# Patient Record
Sex: Male | Born: 1960 | Race: White | Hispanic: No | Marital: Single | Smoking: Never smoker
Health system: Southern US, Community
[De-identification: ages and names within clinical notes are randomized; demographics above are authoritative.]

## PROBLEM LIST (undated history)

## (undated) DIAGNOSIS — F32A Depression, unspecified: Secondary | ICD-10-CM

## (undated) DIAGNOSIS — E119 Type 2 diabetes mellitus without complications: Secondary | ICD-10-CM

## (undated) DIAGNOSIS — F329 Major depressive disorder, single episode, unspecified: Secondary | ICD-10-CM

## (undated) DIAGNOSIS — I10 Essential (primary) hypertension: Secondary | ICD-10-CM

## (undated) DIAGNOSIS — E7800 Pure hypercholesterolemia, unspecified: Secondary | ICD-10-CM

---

## 1999-09-01 ENCOUNTER — Emergency Department (HOSPITAL_COMMUNITY): Admission: EM | Admit: 1999-09-01 | Discharge: 1999-09-01 | Payer: Self-pay

## 2000-04-26 ENCOUNTER — Encounter: Payer: Self-pay | Admitting: Emergency Medicine

## 2000-04-26 ENCOUNTER — Emergency Department (HOSPITAL_COMMUNITY): Admission: EM | Admit: 2000-04-26 | Discharge: 2000-04-26 | Payer: Self-pay | Admitting: Emergency Medicine

## 2006-07-31 ENCOUNTER — Emergency Department (HOSPITAL_COMMUNITY): Admission: EM | Admit: 2006-07-31 | Discharge: 2006-07-31 | Payer: Self-pay | Admitting: Family Medicine

## 2006-11-18 ENCOUNTER — Emergency Department (HOSPITAL_COMMUNITY): Admission: EM | Admit: 2006-11-18 | Discharge: 2006-11-19 | Payer: Self-pay | Admitting: Emergency Medicine

## 2006-12-01 ENCOUNTER — Encounter: Admission: RE | Admit: 2006-12-01 | Discharge: 2007-03-01 | Payer: Self-pay | Admitting: Orthopedic Surgery

## 2006-12-28 ENCOUNTER — Emergency Department (HOSPITAL_COMMUNITY): Admission: EM | Admit: 2006-12-28 | Discharge: 2006-12-28 | Payer: Self-pay | Admitting: Family Medicine

## 2007-03-02 ENCOUNTER — Encounter: Admission: RE | Admit: 2007-03-02 | Discharge: 2007-05-04 | Payer: Self-pay | Admitting: Orthopedic Surgery

## 2007-03-13 ENCOUNTER — Encounter: Admission: RE | Admit: 2007-03-13 | Discharge: 2007-05-04 | Payer: Self-pay | Admitting: Orthopedic Surgery

## 2007-06-11 ENCOUNTER — Emergency Department (HOSPITAL_COMMUNITY): Admission: EM | Admit: 2007-06-11 | Discharge: 2007-06-11 | Payer: Self-pay | Admitting: Family Medicine

## 2007-07-29 ENCOUNTER — Emergency Department (HOSPITAL_COMMUNITY): Admission: EM | Admit: 2007-07-29 | Discharge: 2007-07-29 | Payer: Self-pay | Admitting: Family Medicine

## 2007-09-09 ENCOUNTER — Emergency Department (HOSPITAL_COMMUNITY): Admission: EM | Admit: 2007-09-09 | Discharge: 2007-09-09 | Payer: Self-pay | Admitting: Emergency Medicine

## 2007-09-30 ENCOUNTER — Emergency Department (HOSPITAL_COMMUNITY): Admission: EM | Admit: 2007-09-30 | Discharge: 2007-09-30 | Payer: Self-pay | Admitting: Emergency Medicine

## 2007-11-06 ENCOUNTER — Emergency Department (HOSPITAL_COMMUNITY): Admission: EM | Admit: 2007-11-06 | Discharge: 2007-11-06 | Payer: Self-pay | Admitting: Emergency Medicine

## 2007-11-07 ENCOUNTER — Inpatient Hospital Stay (HOSPITAL_COMMUNITY): Admission: AD | Admit: 2007-11-07 | Discharge: 2007-11-10 | Payer: Self-pay | Admitting: Psychiatry

## 2007-11-07 ENCOUNTER — Ambulatory Visit: Payer: Self-pay | Admitting: Psychiatry

## 2008-02-10 ENCOUNTER — Emergency Department (HOSPITAL_COMMUNITY): Admission: EM | Admit: 2008-02-10 | Discharge: 2008-02-10 | Payer: Self-pay | Admitting: Emergency Medicine

## 2008-02-26 ENCOUNTER — Emergency Department (HOSPITAL_COMMUNITY): Admission: EM | Admit: 2008-02-26 | Discharge: 2008-02-26 | Payer: Self-pay | Admitting: Family Medicine

## 2008-04-02 ENCOUNTER — Emergency Department (HOSPITAL_COMMUNITY): Admission: EM | Admit: 2008-04-02 | Discharge: 2008-04-02 | Payer: Self-pay | Admitting: Emergency Medicine

## 2008-05-08 ENCOUNTER — Emergency Department (HOSPITAL_COMMUNITY): Admission: EM | Admit: 2008-05-08 | Discharge: 2008-05-08 | Payer: Self-pay | Admitting: Emergency Medicine

## 2008-06-15 ENCOUNTER — Emergency Department (HOSPITAL_COMMUNITY): Admission: EM | Admit: 2008-06-15 | Discharge: 2008-06-15 | Payer: Self-pay | Admitting: Family Medicine

## 2008-10-06 ENCOUNTER — Emergency Department (HOSPITAL_COMMUNITY): Admission: EM | Admit: 2008-10-06 | Discharge: 2008-10-06 | Payer: Self-pay | Admitting: Family Medicine

## 2008-10-26 ENCOUNTER — Emergency Department (HOSPITAL_COMMUNITY): Admission: EM | Admit: 2008-10-26 | Discharge: 2008-10-26 | Payer: Self-pay | Admitting: Family Medicine

## 2009-01-12 ENCOUNTER — Emergency Department (HOSPITAL_COMMUNITY): Admission: EM | Admit: 2009-01-12 | Discharge: 2009-01-12 | Payer: Self-pay | Admitting: Family Medicine

## 2009-06-16 ENCOUNTER — Emergency Department (HOSPITAL_COMMUNITY): Admission: EM | Admit: 2009-06-16 | Discharge: 2009-06-16 | Payer: Self-pay | Admitting: Emergency Medicine

## 2009-07-30 ENCOUNTER — Emergency Department (HOSPITAL_COMMUNITY): Admission: EM | Admit: 2009-07-30 | Discharge: 2009-07-30 | Payer: Self-pay | Admitting: Family Medicine

## 2009-08-13 ENCOUNTER — Emergency Department (HOSPITAL_COMMUNITY): Admission: EM | Admit: 2009-08-13 | Discharge: 2009-08-13 | Payer: Self-pay | Admitting: Emergency Medicine

## 2010-06-18 LAB — POCT I-STAT, CHEM 8
BUN: 25 mg/dL — ABNORMAL HIGH (ref 6–23)
Calcium, Ion: 1.05 mmol/L — ABNORMAL LOW (ref 1.12–1.32)
Chloride: 103 mEq/L (ref 96–112)
HCT: 51 % (ref 39.0–52.0)
Potassium: 3.4 mEq/L — ABNORMAL LOW (ref 3.5–5.1)

## 2010-06-18 LAB — GLUCOSE, CAPILLARY: Glucose-Capillary: 214 mg/dL — ABNORMAL HIGH (ref 70–99)
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
## 2010-12-09 LAB — GLUCOSE, CAPILLARY
Glucose-Capillary: 236 — ABNORMAL HIGH
Glucose-Capillary: 256 — ABNORMAL HIGH
Glucose-Capillary: 256 — ABNORMAL HIGH
Glucose-Capillary: 261 — ABNORMAL HIGH
Glucose-Capillary: 269 — ABNORMAL HIGH
Glucose-Capillary: 271 — ABNORMAL HIGH
Glucose-Capillary: 276 — ABNORMAL HIGH
Glucose-Capillary: 279 — ABNORMAL HIGH
Glucose-Capillary: 288 — ABNORMAL HIGH

## 2010-12-11 LAB — POCT RAPID STREP A: Streptococcus, Group A Screen (Direct): NEGATIVE

## 2010-12-16 LAB — I-STAT 8, (EC8 V) (CONVERTED LAB)
Acid-base deficit: 1
BUN: 24 — ABNORMAL HIGH
Bicarbonate: 22
HCT: 50
Operator id: 239701
Sodium: 138
TCO2: 23
pCO2, Ven: 33.2 — ABNORMAL LOW

## 2010-12-16 LAB — POCT I-STAT CREATININE: Creatinine, Ser: 0.7

## 2012-12-28 ENCOUNTER — Emergency Department (HOSPITAL_COMMUNITY)
Admission: EM | Admit: 2012-12-28 | Discharge: 2012-12-28 | Disposition: A | Discharge status: Home / Self Care | Payer: Medicare Other | Attending: Emergency Medicine | Admitting: Emergency Medicine

## 2012-12-28 ENCOUNTER — Encounter (HOSPITAL_COMMUNITY): Payer: Self-pay | Admitting: Emergency Medicine

## 2012-12-28 DIAGNOSIS — R252 Cramp and spasm: Secondary | ICD-10-CM | POA: Insufficient documentation

## 2012-12-28 DIAGNOSIS — E119 Type 2 diabetes mellitus without complications: Secondary | ICD-10-CM | POA: Insufficient documentation

## 2012-12-28 DIAGNOSIS — I10 Essential (primary) hypertension: Secondary | ICD-10-CM | POA: Insufficient documentation

## 2012-12-28 DIAGNOSIS — R51 Headache: Secondary | ICD-10-CM | POA: Insufficient documentation

## 2012-12-28 DIAGNOSIS — M62838 Other muscle spasm: Secondary | ICD-10-CM

## 2012-12-28 DIAGNOSIS — Z794 Long term (current) use of insulin: Secondary | ICD-10-CM | POA: Insufficient documentation

## 2012-12-28 DIAGNOSIS — Z79899 Other long term (current) drug therapy: Secondary | ICD-10-CM | POA: Insufficient documentation

## 2012-12-28 HISTORY — DX: Essential (primary) hypertension: I10

## 2012-12-28 HISTORY — DX: Type 2 diabetes mellitus without complications: E11.9

## 2012-12-28 LAB — BASIC METABOLIC PANEL
BUN: 21 mg/dL (ref 6–23)
Creatinine, Ser: 0.67 mg/dL (ref 0.50–1.35)
GFR calc Af Amer: >90 mL/min (ref >90)
GFR calc non Af Amer: >90 mL/min (ref >90)
Glucose, Bld: 188 mg/dL — ABNORMAL HIGH (ref 70–99)
Potassium: 3.7 mEq/L (ref 3.5–5.1)
Sodium: 139 mEq/L (ref 135–145)

## 2012-12-28 LAB — CBC
HCT: 41 % (ref 39.0–52.0)
MCHC: 34.6 g/dL (ref 30.0–36.0)
Platelets: 180 10*3/uL (ref 150–400)
RDW: 12.9 % (ref 11.5–15.5)
WBC: 5.8 10*3/uL (ref 4.0–10.5)

## 2012-12-28 LAB — MAGNESIUM: Magnesium: 2 mg/dL (ref 1.5–2.5)

## 2012-12-28 MED ORDER — DIAZEPAM 10 MG PO TABS: 10.0000 mg | ORAL_TABLET | Freq: Three times a day (TID) | ORAL | Status: AC | PRN

## 2012-12-28 MED ORDER — DIPHENHYDRAMINE HCL 25 MG PO CAPS
50.0000 mg | ORAL_CAPSULE | Freq: Once | ORAL | Status: AC
  Administered 2012-12-28: 50 mg via ORAL
  Filled 2012-12-28: qty 2

## 2012-12-28 MED ORDER — DIAZEPAM 5 MG PO TABS
10.0000 mg | ORAL_TABLET | Freq: Once | ORAL | Status: AC
  Administered 2012-12-28: 10 mg via ORAL
  Filled 2012-12-28: qty 2

## 2012-12-28 NOTE — ED Notes (Signed)
Called to pt room by family member. Pt having facial spasm and states is unable to breath. O2 Sat of 88%. Nonrebreather placed and Dr. Gwendolyn Grant called to bedside at this time.

## 2012-12-28 NOTE — ED Notes (Signed)
Pt arrived by [facility]. Reports at approx 2pm, onset of muscle spasms to face and right leg. "face draws up" and right foot turns outward. Reports hx of same over the past month but it usually resolved with rest.

## 2012-12-28 NOTE — ED Notes (Signed)
Pt's SpO2 up to 100% on RA, face is relaxed, pt states feels better. Dr. Gwendolyn Grant remains at bedside

## 2012-12-28 NOTE — ED Provider Notes (Signed)
CSN: [id number]     Arrival date & time 12/28/12  1559 History   First MD Initiated Contact with Patient 12/28/12 1629     Chief Complaint  Patient presents with  . Leg Pain  . Facial Pain   (Consider location/radiation/quality/duration/timing/severity/associated sxs/prior Treatment) HPI Comments: Intermittent cramps over the past month, worsening today. Cramping and face, legs.  Patient is a 52 y.o. male presenting with general illness. The history is provided by the patient.  Illness Location:  At home Quality:  Cramping, muscld tensing sensation Severity:  Moderate Onset quality:  Sudden Timing:  Sporadic Progression:  Partially resolved Chronicity:  New Context:  No new exercise, no diet changes, no medication changes. Relieved by:  Nothing Worsened by:  Nothing Associated symptoms: no cough, no fever and no shortness of breath     Past Medical History  Diagnosis Date  . Diabetes mellitus without complication   . Hypertension    History reviewed. No pertinent past surgical history. History reviewed. No pertinent family history. History  Substance Use Topics  . Smoking status: Not on file  . Smokeless tobacco: Not on file  . Alcohol Use: No    Review of Systems  Constitutional: Negative for fever.  Respiratory: Negative for cough and shortness of breath.   All other systems reviewed and are negative.    Allergies  Review of patient's allergies indicates no known allergies.  Home Medications   Current Outpatient Rx  Name  Route  Sig  Dispense  Refill  . alendronate (FOSAMAX) 70 MG tablet   Oral   Take 70 mg by mouth every 7 (seven) days. Take with a full glass of water on an empty stomach.         . busPIRone (BUSPAR) 15 MG tablet   Oral   Take 15 mg by mouth 3 (three) times daily.         . enalapril (VASOTEC) 10 MG tablet   Oral   Take 10 mg by mouth daily.         Marland Kitchen FLUoxetine (PROZAC) 10 MG capsule   Oral   Take 20 mg by mouth daily.        . insulin NPH (HUMULIN N,NOVOLIN N) 100 UNIT/ML injection   Subcutaneous   Inject 36 Units into the skin 2 (two) times daily.         . insulin regular (NOVOLIN R,HUMULIN R) 100 units/mL injection   Subcutaneous   Inject 8 Units into the skin 2 (two) times daily.         . simvastatin (ZOCOR) 20 MG tablet   Oral   Take 20 mg by mouth every evening.          There were no vitals taken for this visit. Physical Exam  Nursing note and vitals reviewed. Constitutional: He is oriented to person, place, and time. He appears well-developed and well-nourished. No distress.  HENT:  Head: Normocephalic and atraumatic.  Mouth/Throat: No oropharyngeal exudate.  Eyes: EOM are normal. Pupils are equal, round, and reactive to light.  Neck: Normal range of motion. Neck supple.  Cardiovascular: Normal rate and regular rhythm.  Exam reveals no friction rub.   No murmur heard. Pulmonary/Chest: Effort normal and breath sounds normal. No respiratory distress. He has no wheezes. He has no rales.  Abdominal: He exhibits no distension. There is no tenderness. There is no rebound.  Musculoskeletal: Normal range of motion. He exhibits no edema.  Neurological: He is alert and oriented to  person, place, and time.  Skin: He is not diaphoretic.    ED Course  Procedures (including critical care time) Labs Review Labs Reviewed  CBC  BASIC METABOLIC PANEL  MAGNESIUM   Imaging Review No results found.  EKG Interpretation   None       MDM   1. Muscle spasm    61M here with cramps. Happened once one month ago, and once two days ago. Happened again today. Having cramps in his face with one in his foot. Feels the muscles tense up in his face and foot. Has not started any medications, stopped medications. Not on any diuretics. Not on any anti-psychotics. Denies dehydration, exercising, diet changes.  Here afebrile, mild tachycardia. Normal cranial nerves, negative Tvostek's sign/Trousseau  sign. Will check labs. No concern for stroke.  LateralLupita Leash with cramps. Discharge home with prescription for same   Dagmar Hait, MD 12/29/12 [phone]

## 2013-08-01 ENCOUNTER — Emergency Department (HOSPITAL_BASED_OUTPATIENT_CLINIC_OR_DEPARTMENT_OTHER)
Admission: EM | Admit: 2013-08-01 | Discharge: 2013-08-01 | Disposition: A | Discharge status: Home / Self Care | Payer: Medicare HMO | Attending: Emergency Medicine | Admitting: Emergency Medicine

## 2013-08-01 ENCOUNTER — Emergency Department (HOSPITAL_BASED_OUTPATIENT_CLINIC_OR_DEPARTMENT_OTHER): Payer: Medicare HMO

## 2013-08-01 ENCOUNTER — Encounter (HOSPITAL_BASED_OUTPATIENT_CLINIC_OR_DEPARTMENT_OTHER): Payer: Self-pay | Admitting: Emergency Medicine

## 2013-08-01 DIAGNOSIS — R059 Cough, unspecified: Secondary | ICD-10-CM

## 2013-08-01 DIAGNOSIS — E119 Type 2 diabetes mellitus without complications: Secondary | ICD-10-CM | POA: Insufficient documentation

## 2013-08-01 DIAGNOSIS — Z79899 Other long term (current) drug therapy: Secondary | ICD-10-CM | POA: Insufficient documentation

## 2013-08-01 DIAGNOSIS — J45909 Unspecified asthma, uncomplicated: Secondary | ICD-10-CM | POA: Insufficient documentation

## 2013-08-01 DIAGNOSIS — I10 Essential (primary) hypertension: Secondary | ICD-10-CM | POA: Insufficient documentation

## 2013-08-01 DIAGNOSIS — Z794 Long term (current) use of insulin: Secondary | ICD-10-CM | POA: Insufficient documentation

## 2013-08-01 DIAGNOSIS — R05 Cough: Secondary | ICD-10-CM

## 2013-08-01 MED ORDER — HYDROCOD POLST-CHLORPHEN POLST 10-8 MG/5ML PO LQCR: 5.0000 mL | Freq: Two times a day (BID) | ORAL | Status: AC | PRN

## 2013-08-01 NOTE — ED Notes (Addendum)
C/o "chest congestion"-nonprod cough started last night-"sinuses been crazy for about a week with stuff dripping down the back of my throat"

## 2013-08-01 NOTE — ED Provider Notes (Signed)
Medical screening examination/treatment/procedure(s) were performed by non-physician practitioner and as supervising physician I was immediately available for consultation/collaboration.   EKG Interpretation None        Blanchie Dessert, MD 08/01/13 2330

## 2013-08-01 NOTE — ED Notes (Signed)
np at bedside

## 2013-08-01 NOTE — ED Provider Notes (Signed)
CSN: [id number]     Arrival date & time 08/01/13  1539 History   First MD Initiated Contact with Patient 08/01/13 1709     Chief Complaint  Patient presents with  . Cough     (Consider location/radiation/quality/duration/timing/severity/associated sxs/prior Treatment) HPI Comments: Pt has had an non productive cough since yesterday. Pt has asthma as a child. No fever. Has had some sinus congestion. Feels stuff in the back of the throat  The history is provided by the patient. No language interpreter was used.    Past Medical History  Diagnosis Date  . Diabetes mellitus without complication   . Hypertension    History reviewed. No pertinent past surgical history. No family history on file. History  Substance Use Topics  . Smoking status: Never Smoker   . Smokeless tobacco: Not on file  . Alcohol Use: No    Review of Systems  Constitutional: Negative.   Respiratory: Positive for cough.   Cardiovascular: Negative.       Allergies  Review of patient's allergies indicates no known allergies.  Home Medications   Prior to Admission medications   Medication Sig Start Date End Date Taking? Authorizing Provider  alendronate (FOSAMAX) 70 MG tablet Take 70 mg by mouth every 7 (seven) days. Take with a full glass of water on an empty stomach.    Historical Provider, MD  busPIRone (BUSPAR) 15 MG tablet Take 15 mg by mouth 3 (three) times daily.    Historical Provider, MD  diazepam (VALIUM) 10 MG tablet Take 1 tablet (10 mg total) by mouth every 8 (eight) hours as needed for anxiety. 12/28/12   Osvaldo Shipper, MD  enalapril (VASOTEC) 10 MG tablet Take 10 mg by mouth daily.    Historical Provider, MD  FLUoxetine (PROZAC) 10 MG capsule Take 20 mg by mouth daily.    Historical Provider, MD  insulin NPH (HUMULIN N,NOVOLIN N) 100 UNIT/ML injection Inject 36 Units into the skin 2 (two) times daily.    Historical Provider, MD  insulin regular (NOVOLIN R,HUMULIN R) 100 units/mL  injection Inject 8 Units into the skin 2 (two) times daily.    Historical Provider, MD  simvastatin (ZOCOR) 20 MG tablet Take 20 mg by mouth every evening.    Historical Provider, MD   BP 133/76  Pulse 94  Temp(Src) 98.6 F (37 C) (Oral)  Resp 20  Ht 5\' 10"  (1.778 m)  Wt 178 lb (80.74 kg)  BMI 25.54 kg/m2  SpO2 98% Physical Exam  Nursing note and vitals reviewed. Constitutional: He is oriented to person, place, and time. He appears well-developed.  HENT:  Head: Normocephalic and atraumatic.  Eyes: Conjunctivae and EOM are normal.  Cardiovascular: Normal rate and regular rhythm.   Pulmonary/Chest: Effort normal and breath sounds normal.  Musculoskeletal: Normal range of motion.  Neurological: He is oriented to person, place, and time.  Skin: Skin is warm and dry.    ED Course  Procedures (including critical care time) Labs Review Labs Reviewed - No data to display  Imaging Review Dg Chest 2 View  08/01/2013   CLINICAL DATA:  chest congestion  EXAM: CHEST - 2 VIEW  COMPARISON:  None available  FINDINGS: Lungs are clear. Heart size and mediastinal contours are within normal limits. No effusion. Minimal spurring in the mid thoracic spine.  IMPRESSION: No acute cardiopulmonary disease.   Electronically Signed   By: Arne Cleveland M.D.   On: 08/01/2013 18:17     EKG Interpretation None  MDM   Final diagnoses:  Cough    No infection noted on x-ray;will treat symptomatically    Glendell Docker, NP 08/01/13 1835

## 2013-08-01 NOTE — Discharge Instructions (Signed)

## 2013-10-26 ENCOUNTER — Encounter (HOSPITAL_BASED_OUTPATIENT_CLINIC_OR_DEPARTMENT_OTHER): Payer: Self-pay | Admitting: Emergency Medicine

## 2013-10-26 ENCOUNTER — Emergency Department (HOSPITAL_BASED_OUTPATIENT_CLINIC_OR_DEPARTMENT_OTHER): Payer: No Typology Code available for payment source

## 2013-10-26 ENCOUNTER — Emergency Department (HOSPITAL_BASED_OUTPATIENT_CLINIC_OR_DEPARTMENT_OTHER)
Admission: EM | Admit: 2013-10-26 | Discharge: 2013-10-26 | Disposition: A | Discharge status: Home / Self Care | Payer: No Typology Code available for payment source | Attending: Emergency Medicine | Admitting: Emergency Medicine

## 2013-10-26 DIAGNOSIS — E119 Type 2 diabetes mellitus without complications: Secondary | ICD-10-CM | POA: Diagnosis not present

## 2013-10-26 DIAGNOSIS — I10 Essential (primary) hypertension: Secondary | ICD-10-CM | POA: Insufficient documentation

## 2013-10-26 DIAGNOSIS — S298XXA Other specified injuries of thorax, initial encounter: Secondary | ICD-10-CM | POA: Diagnosis present

## 2013-10-26 DIAGNOSIS — Z794 Long term (current) use of insulin: Secondary | ICD-10-CM | POA: Diagnosis not present

## 2013-10-26 DIAGNOSIS — Y92410 Unspecified street and highway as the place of occurrence of the external cause: Secondary | ICD-10-CM | POA: Diagnosis not present

## 2013-10-26 DIAGNOSIS — IMO0002 Reserved for concepts with insufficient information to code with codable children: Secondary | ICD-10-CM | POA: Insufficient documentation

## 2013-10-26 DIAGNOSIS — F329 Major depressive disorder, single episode, unspecified: Secondary | ICD-10-CM | POA: Insufficient documentation

## 2013-10-26 DIAGNOSIS — E7800 Pure hypercholesterolemia, unspecified: Secondary | ICD-10-CM | POA: Diagnosis not present

## 2013-10-26 DIAGNOSIS — Z79899 Other long term (current) drug therapy: Secondary | ICD-10-CM | POA: Insufficient documentation

## 2013-10-26 DIAGNOSIS — Y9389 Activity, other specified: Secondary | ICD-10-CM | POA: Insufficient documentation

## 2013-10-26 DIAGNOSIS — T148XXA Other injury of unspecified body region, initial encounter: Secondary | ICD-10-CM

## 2013-10-26 DIAGNOSIS — F3289 Other specified depressive episodes: Secondary | ICD-10-CM | POA: Insufficient documentation

## 2013-10-26 HISTORY — DX: Depression, unspecified: F32.A

## 2013-10-26 HISTORY — DX: Pure hypercholesterolemia, unspecified: E78.00

## 2013-10-26 HISTORY — DX: Major depressive disorder, single episode, unspecified: F32.9

## 2013-10-26 LAB — CBG MONITORING, ED: GLUCOSE-CAPILLARY: 293 mg/dL — AB (ref 70–99)

## 2013-10-26 MED ORDER — IBUPROFEN 800 MG PO TABS: 800.0000 mg | ORAL_TABLET | Freq: Three times a day (TID) | ORAL | Status: AC

## 2013-10-26 MED ORDER — HYDROCODONE-ACETAMINOPHEN 5-325 MG PO TABS: 1.0000 | ORAL_TABLET | ORAL | Status: AC | PRN

## 2013-10-26 MED ORDER — CYCLOBENZAPRINE HCL 10 MG PO TABS: 10.0000 mg | ORAL_TABLET | Freq: Two times a day (BID) | ORAL | Status: AC | PRN
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
## 2013-10-26 NOTE — ED Provider Notes (Signed)
Medical screening examination/treatment/procedure(s) were performed by non-physician practitioner and as supervising physician I was immediately available for consultation/collaboration.   EKG Interpretation   Date/Time:  Friday October 26 2013 16:47:27 EDT Ventricular Rate:  114 PR Interval:  142 QRS Duration: 102 QT Interval:  358 QTC Calculation: 493 R Axis:   -72 Text Interpretation:  Sinus tachycardia Left anterior fascicular block  Inferior infarct , age undetermined Abnormal ECG Similar to prior  Confirmed by Mingo Amber  MD, [place] ([id number]) on 10/26/2013 5:01:17 PM        Evelina Bucy, MD 10/26/13 2130

## 2015-04-08 DIAGNOSIS — E1065 Type 1 diabetes mellitus with hyperglycemia: Secondary | ICD-10-CM | POA: Diagnosis not present

## 2015-04-08 DIAGNOSIS — E1040 Type 1 diabetes mellitus with diabetic neuropathy, unspecified: Secondary | ICD-10-CM | POA: Diagnosis not present

## 2015-04-08 DIAGNOSIS — Z794 Long term (current) use of insulin: Secondary | ICD-10-CM | POA: Diagnosis not present

## 2015-04-08 DIAGNOSIS — E559 Vitamin D deficiency, unspecified: Secondary | ICD-10-CM | POA: Diagnosis not present

## 2015-04-08 DIAGNOSIS — M810 Age-related osteoporosis without current pathological fracture: Secondary | ICD-10-CM | POA: Diagnosis not present

## 2015-04-16 DIAGNOSIS — J029 Acute pharyngitis, unspecified: Secondary | ICD-10-CM | POA: Diagnosis not present

## 2015-04-16 DIAGNOSIS — J069 Acute upper respiratory infection, unspecified: Secondary | ICD-10-CM | POA: Diagnosis not present

## 2015-06-24 DIAGNOSIS — H6123 Impacted cerumen, bilateral: Secondary | ICD-10-CM | POA: Diagnosis not present

## 2015-07-16 DIAGNOSIS — Z794 Long term (current) use of insulin: Secondary | ICD-10-CM | POA: Diagnosis not present

## 2015-07-16 DIAGNOSIS — E1042 Type 1 diabetes mellitus with diabetic polyneuropathy: Secondary | ICD-10-CM | POA: Diagnosis not present

## 2015-07-16 DIAGNOSIS — E559 Vitamin D deficiency, unspecified: Secondary | ICD-10-CM | POA: Diagnosis not present

## 2015-07-16 DIAGNOSIS — M818 Other osteoporosis without current pathological fracture: Secondary | ICD-10-CM | POA: Diagnosis not present

## 2015-07-16 DIAGNOSIS — E1065 Type 1 diabetes mellitus with hyperglycemia: Secondary | ICD-10-CM | POA: Diagnosis not present

## 2015-10-13 DIAGNOSIS — M79672 Pain in left foot: Secondary | ICD-10-CM | POA: Diagnosis not present

## 2015-10-13 DIAGNOSIS — M7989 Other specified soft tissue disorders: Secondary | ICD-10-CM | POA: Diagnosis not present

## 2015-10-22 DIAGNOSIS — M7989 Other specified soft tissue disorders: Secondary | ICD-10-CM | POA: Diagnosis not present

## 2015-10-22 DIAGNOSIS — M818 Other osteoporosis without current pathological fracture: Secondary | ICD-10-CM | POA: Diagnosis not present

## 2015-10-22 DIAGNOSIS — E559 Vitamin D deficiency, unspecified: Secondary | ICD-10-CM | POA: Diagnosis not present

## 2015-10-22 DIAGNOSIS — Z794 Long term (current) use of insulin: Secondary | ICD-10-CM | POA: Diagnosis not present

## 2015-10-22 DIAGNOSIS — E1042 Type 1 diabetes mellitus with diabetic polyneuropathy: Secondary | ICD-10-CM | POA: Diagnosis not present

## 2015-10-22 DIAGNOSIS — E1065 Type 1 diabetes mellitus with hyperglycemia: Secondary | ICD-10-CM | POA: Diagnosis not present

## 2015-11-24 DIAGNOSIS — J069 Acute upper respiratory infection, unspecified: Secondary | ICD-10-CM | POA: Diagnosis not present

## 2015-11-24 DIAGNOSIS — J029 Acute pharyngitis, unspecified: Secondary | ICD-10-CM | POA: Diagnosis not present

## 2016-01-28 DIAGNOSIS — E1042 Type 1 diabetes mellitus with diabetic polyneuropathy: Secondary | ICD-10-CM | POA: Diagnosis not present

## 2016-01-28 DIAGNOSIS — E1065 Type 1 diabetes mellitus with hyperglycemia: Secondary | ICD-10-CM | POA: Diagnosis not present

## 2016-01-28 DIAGNOSIS — E559 Vitamin D deficiency, unspecified: Secondary | ICD-10-CM | POA: Diagnosis not present

## 2016-01-28 DIAGNOSIS — I10 Essential (primary) hypertension: Secondary | ICD-10-CM | POA: Diagnosis not present

## 2016-01-28 DIAGNOSIS — Z794 Long term (current) use of insulin: Secondary | ICD-10-CM | POA: Diagnosis not present

## 2016-04-07 DIAGNOSIS — L97529 Non-pressure chronic ulcer of other part of left foot with unspecified severity: Secondary | ICD-10-CM | POA: Diagnosis not present

## 2016-04-07 DIAGNOSIS — E11621 Type 2 diabetes mellitus with foot ulcer: Secondary | ICD-10-CM | POA: Diagnosis not present

## 2016-04-08 DIAGNOSIS — F329 Major depressive disorder, single episode, unspecified: Secondary | ICD-10-CM | POA: Diagnosis not present

## 2016-04-08 DIAGNOSIS — M2010 Hallux valgus (acquired), unspecified foot: Secondary | ICD-10-CM | POA: Diagnosis not present

## 2016-04-08 DIAGNOSIS — Z794 Long term (current) use of insulin: Secondary | ICD-10-CM | POA: Diagnosis not present

## 2016-04-08 DIAGNOSIS — Z7983 Long term (current) use of bisphosphonates: Secondary | ICD-10-CM | POA: Diagnosis not present

## 2016-04-08 DIAGNOSIS — E11621 Type 2 diabetes mellitus with foot ulcer: Secondary | ICD-10-CM | POA: Diagnosis not present

## 2016-04-08 DIAGNOSIS — L97529 Non-pressure chronic ulcer of other part of left foot with unspecified severity: Secondary | ICD-10-CM | POA: Diagnosis not present

## 2016-04-08 DIAGNOSIS — F419 Anxiety disorder, unspecified: Secondary | ICD-10-CM | POA: Diagnosis not present

## 2016-04-08 DIAGNOSIS — E785 Hyperlipidemia, unspecified: Secondary | ICD-10-CM | POA: Diagnosis not present

## 2016-04-21 DIAGNOSIS — E559 Vitamin D deficiency, unspecified: Secondary | ICD-10-CM | POA: Diagnosis not present

## 2016-04-21 DIAGNOSIS — M818 Other osteoporosis without current pathological fracture: Secondary | ICD-10-CM | POA: Diagnosis not present

## 2016-04-21 DIAGNOSIS — R6883 Chills (without fever): Secondary | ICD-10-CM | POA: Diagnosis not present

## 2016-04-21 DIAGNOSIS — E1065 Type 1 diabetes mellitus with hyperglycemia: Secondary | ICD-10-CM | POA: Diagnosis not present

## 2016-04-21 DIAGNOSIS — J069 Acute upper respiratory infection, unspecified: Secondary | ICD-10-CM | POA: Diagnosis not present

## 2016-04-21 DIAGNOSIS — E1042 Type 1 diabetes mellitus with diabetic polyneuropathy: Secondary | ICD-10-CM | POA: Diagnosis not present

## 2016-04-21 DIAGNOSIS — R52 Pain, unspecified: Secondary | ICD-10-CM | POA: Diagnosis not present

## 2016-04-28 DIAGNOSIS — R05 Cough: Secondary | ICD-10-CM | POA: Diagnosis not present

## 2016-04-28 DIAGNOSIS — J069 Acute upper respiratory infection, unspecified: Secondary | ICD-10-CM | POA: Diagnosis not present

## 2016-07-01 DIAGNOSIS — H6123 Impacted cerumen, bilateral: Secondary | ICD-10-CM | POA: Diagnosis not present

## 2016-07-01 DIAGNOSIS — J018 Other acute sinusitis: Secondary | ICD-10-CM | POA: Diagnosis not present

## 2016-07-20 DIAGNOSIS — E559 Vitamin D deficiency, unspecified: Secondary | ICD-10-CM | POA: Diagnosis not present

## 2016-07-20 DIAGNOSIS — Z794 Long term (current) use of insulin: Secondary | ICD-10-CM | POA: Diagnosis not present

## 2016-07-20 DIAGNOSIS — E1042 Type 1 diabetes mellitus with diabetic polyneuropathy: Secondary | ICD-10-CM | POA: Diagnosis not present

## 2016-07-20 DIAGNOSIS — M818 Other osteoporosis without current pathological fracture: Secondary | ICD-10-CM | POA: Diagnosis not present

## 2016-07-20 DIAGNOSIS — E1065 Type 1 diabetes mellitus with hyperglycemia: Secondary | ICD-10-CM | POA: Diagnosis not present

## 2016-08-09 DIAGNOSIS — M85852 Other specified disorders of bone density and structure, left thigh: Secondary | ICD-10-CM | POA: Diagnosis not present

## 2016-08-09 DIAGNOSIS — M810 Age-related osteoporosis without current pathological fracture: Secondary | ICD-10-CM | POA: Diagnosis not present

## 2016-11-01 DIAGNOSIS — E559 Vitamin D deficiency, unspecified: Secondary | ICD-10-CM | POA: Diagnosis not present

## 2016-11-01 DIAGNOSIS — E1065 Type 1 diabetes mellitus with hyperglycemia: Secondary | ICD-10-CM | POA: Diagnosis not present

## 2016-11-01 DIAGNOSIS — E1042 Type 1 diabetes mellitus with diabetic polyneuropathy: Secondary | ICD-10-CM | POA: Diagnosis not present

## 2016-11-01 DIAGNOSIS — M818 Other osteoporosis without current pathological fracture: Secondary | ICD-10-CM | POA: Diagnosis not present

## 2016-11-01 DIAGNOSIS — Z794 Long term (current) use of insulin: Secondary | ICD-10-CM | POA: Diagnosis not present

## 2017-02-01 DIAGNOSIS — J018 Other acute sinusitis: Secondary | ICD-10-CM | POA: Diagnosis not present

## 2017-02-01 DIAGNOSIS — J069 Acute upper respiratory infection, unspecified: Secondary | ICD-10-CM | POA: Diagnosis not present

## 2017-02-01 DIAGNOSIS — R05 Cough: Secondary | ICD-10-CM | POA: Diagnosis not present

## 2017-02-24 DIAGNOSIS — E1042 Type 1 diabetes mellitus with diabetic polyneuropathy: Secondary | ICD-10-CM | POA: Diagnosis not present

## 2017-02-24 DIAGNOSIS — Z794 Long term (current) use of insulin: Secondary | ICD-10-CM | POA: Diagnosis not present

## 2017-02-24 DIAGNOSIS — M818 Other osteoporosis without current pathological fracture: Secondary | ICD-10-CM | POA: Diagnosis not present

## 2017-02-24 DIAGNOSIS — I10 Essential (primary) hypertension: Secondary | ICD-10-CM | POA: Diagnosis not present

## 2017-04-22 DIAGNOSIS — E1042 Type 1 diabetes mellitus with diabetic polyneuropathy: Secondary | ICD-10-CM | POA: Diagnosis not present

## 2017-04-22 DIAGNOSIS — J060 Acute laryngopharyngitis: Secondary | ICD-10-CM | POA: Diagnosis not present

## 2017-04-22 DIAGNOSIS — J069 Acute upper respiratory infection, unspecified: Secondary | ICD-10-CM | POA: Diagnosis not present

## 2017-04-22 DIAGNOSIS — J0141 Acute recurrent pansinusitis: Secondary | ICD-10-CM | POA: Diagnosis not present

## 2017-04-26 DIAGNOSIS — J22 Unspecified acute lower respiratory infection: Secondary | ICD-10-CM | POA: Diagnosis not present

## 2017-04-26 DIAGNOSIS — R062 Wheezing: Secondary | ICD-10-CM | POA: Diagnosis not present

## 2017-04-26 DIAGNOSIS — R0789 Other chest pain: Secondary | ICD-10-CM | POA: Diagnosis not present

## 2017-04-26 DIAGNOSIS — R0781 Pleurodynia: Secondary | ICD-10-CM | POA: Diagnosis not present

## 2017-05-05 DIAGNOSIS — J209 Acute bronchitis, unspecified: Secondary | ICD-10-CM | POA: Diagnosis not present

## 2017-05-25 DIAGNOSIS — E1065 Type 1 diabetes mellitus with hyperglycemia: Secondary | ICD-10-CM | POA: Diagnosis not present

## 2017-05-25 DIAGNOSIS — E559 Vitamin D deficiency, unspecified: Secondary | ICD-10-CM | POA: Diagnosis not present

## 2017-05-25 DIAGNOSIS — I10 Essential (primary) hypertension: Secondary | ICD-10-CM | POA: Diagnosis not present

## 2017-05-25 DIAGNOSIS — E1042 Type 1 diabetes mellitus with diabetic polyneuropathy: Secondary | ICD-10-CM | POA: Diagnosis not present

## 2017-05-25 DIAGNOSIS — Z794 Long term (current) use of insulin: Secondary | ICD-10-CM | POA: Diagnosis not present

## 2017-06-27 DIAGNOSIS — J0181 Other acute recurrent sinusitis: Secondary | ICD-10-CM | POA: Diagnosis not present

## 2017-06-27 DIAGNOSIS — J069 Acute upper respiratory infection, unspecified: Secondary | ICD-10-CM | POA: Diagnosis not present

## 2017-06-27 DIAGNOSIS — R05 Cough: Secondary | ICD-10-CM | POA: Diagnosis not present

## 2017-08-30 DIAGNOSIS — M818 Other osteoporosis without current pathological fracture: Secondary | ICD-10-CM | POA: Diagnosis not present

## 2017-08-30 DIAGNOSIS — E559 Vitamin D deficiency, unspecified: Secondary | ICD-10-CM | POA: Diagnosis not present

## 2017-08-30 DIAGNOSIS — E1042 Type 1 diabetes mellitus with diabetic polyneuropathy: Secondary | ICD-10-CM | POA: Diagnosis not present

## 2017-08-30 DIAGNOSIS — I10 Essential (primary) hypertension: Secondary | ICD-10-CM | POA: Diagnosis not present

## 2017-08-30 DIAGNOSIS — E1065 Type 1 diabetes mellitus with hyperglycemia: Secondary | ICD-10-CM | POA: Diagnosis not present

## 2017-08-30 DIAGNOSIS — M545 Low back pain: Secondary | ICD-10-CM | POA: Diagnosis not present

## 2017-08-30 DIAGNOSIS — Z794 Long term (current) use of insulin: Secondary | ICD-10-CM | POA: Diagnosis not present

## 2017-12-05 DIAGNOSIS — M7712 Lateral epicondylitis, left elbow: Secondary | ICD-10-CM | POA: Diagnosis not present

## 2017-12-05 DIAGNOSIS — H6123 Impacted cerumen, bilateral: Secondary | ICD-10-CM | POA: Diagnosis not present

## 2017-12-05 DIAGNOSIS — L299 Pruritus, unspecified: Secondary | ICD-10-CM | POA: Diagnosis not present

## 2017-12-08 DIAGNOSIS — I10 Essential (primary) hypertension: Secondary | ICD-10-CM | POA: Diagnosis not present

## 2017-12-08 DIAGNOSIS — E1042 Type 1 diabetes mellitus with diabetic polyneuropathy: Secondary | ICD-10-CM | POA: Diagnosis not present

## 2017-12-08 DIAGNOSIS — M818 Other osteoporosis without current pathological fracture: Secondary | ICD-10-CM | POA: Diagnosis not present

## 2017-12-08 DIAGNOSIS — E1065 Type 1 diabetes mellitus with hyperglycemia: Secondary | ICD-10-CM | POA: Diagnosis not present

## 2017-12-08 DIAGNOSIS — E559 Vitamin D deficiency, unspecified: Secondary | ICD-10-CM | POA: Diagnosis not present

## 2017-12-21 DIAGNOSIS — M549 Dorsalgia, unspecified: Secondary | ICD-10-CM | POA: Diagnosis not present

## 2017-12-21 DIAGNOSIS — S3992XA Unspecified injury of lower back, initial encounter: Secondary | ICD-10-CM | POA: Diagnosis not present

## 2017-12-21 DIAGNOSIS — S20222A Contusion of left back wall of thorax, initial encounter: Secondary | ICD-10-CM | POA: Diagnosis not present

## 2017-12-21 DIAGNOSIS — S80212A Abrasion, left knee, initial encounter: Secondary | ICD-10-CM | POA: Diagnosis not present

## 2017-12-21 DIAGNOSIS — S300XXA Contusion of lower back and pelvis, initial encounter: Secondary | ICD-10-CM | POA: Diagnosis not present

## 2017-12-21 DIAGNOSIS — M5115 Intervertebral disc disorders with radiculopathy, thoracolumbar region: Secondary | ICD-10-CM | POA: Diagnosis not present

## 2018-01-26 DIAGNOSIS — J209 Acute bronchitis, unspecified: Secondary | ICD-10-CM | POA: Diagnosis not present

## 2018-03-14 DIAGNOSIS — M818 Other osteoporosis without current pathological fracture: Secondary | ICD-10-CM | POA: Diagnosis not present

## 2018-03-14 DIAGNOSIS — E1042 Type 1 diabetes mellitus with diabetic polyneuropathy: Secondary | ICD-10-CM | POA: Diagnosis not present

## 2018-03-14 DIAGNOSIS — E1065 Type 1 diabetes mellitus with hyperglycemia: Secondary | ICD-10-CM | POA: Diagnosis not present

## 2018-03-14 DIAGNOSIS — E559 Vitamin D deficiency, unspecified: Secondary | ICD-10-CM | POA: Diagnosis not present

## 2018-03-14 DIAGNOSIS — Z794 Long term (current) use of insulin: Secondary | ICD-10-CM | POA: Diagnosis not present

## 2018-03-14 DIAGNOSIS — I10 Essential (primary) hypertension: Secondary | ICD-10-CM | POA: Diagnosis not present

## 2018-03-30 DIAGNOSIS — E109 Type 1 diabetes mellitus without complications: Secondary | ICD-10-CM | POA: Diagnosis not present

## 2018-03-30 DIAGNOSIS — M21619 Bunion of unspecified foot: Secondary | ICD-10-CM | POA: Diagnosis not present

## 2018-06-07 DIAGNOSIS — E1165 Type 2 diabetes mellitus with hyperglycemia: Secondary | ICD-10-CM | POA: Diagnosis not present

## 2018-06-07 DIAGNOSIS — Z23 Encounter for immunization: Secondary | ICD-10-CM | POA: Diagnosis not present

## 2018-06-07 DIAGNOSIS — K50913 Crohn's disease, unspecified, with fistula: Secondary | ICD-10-CM | POA: Diagnosis not present

## 2018-06-07 DIAGNOSIS — B070 Plantar wart: Secondary | ICD-10-CM | POA: Diagnosis not present

## 2018-06-07 DIAGNOSIS — F209 Schizophrenia, unspecified: Secondary | ICD-10-CM | POA: Diagnosis not present

## 2018-06-14 DIAGNOSIS — J449 Chronic obstructive pulmonary disease, unspecified: Secondary | ICD-10-CM | POA: Diagnosis not present

## 2018-06-14 DIAGNOSIS — T40605A Adverse effect of unspecified narcotics, initial encounter: Secondary | ICD-10-CM | POA: Diagnosis not present

## 2018-06-14 DIAGNOSIS — Z0000 Encounter for general adult medical examination without abnormal findings: Secondary | ICD-10-CM | POA: Diagnosis not present

## 2018-06-14 DIAGNOSIS — E1040 Type 1 diabetes mellitus with diabetic neuropathy, unspecified: Secondary | ICD-10-CM | POA: Diagnosis not present

## 2018-06-14 DIAGNOSIS — C252 Malignant neoplasm of tail of pancreas: Secondary | ICD-10-CM | POA: Diagnosis not present

## 2018-06-14 DIAGNOSIS — J4530 Mild persistent asthma, uncomplicated: Secondary | ICD-10-CM | POA: Diagnosis not present

## 2018-06-14 DIAGNOSIS — G92 Toxic encephalopathy: Secondary | ICD-10-CM | POA: Diagnosis not present

## 2018-06-14 DIAGNOSIS — T48205A Adverse effect of unspecified drugs acting on muscles, initial encounter: Secondary | ICD-10-CM | POA: Diagnosis not present

## 2018-06-14 DIAGNOSIS — M79604 Pain in right leg: Secondary | ICD-10-CM | POA: Diagnosis not present

## 2018-06-14 DIAGNOSIS — E1042 Type 1 diabetes mellitus with diabetic polyneuropathy: Secondary | ICD-10-CM | POA: Diagnosis not present

## 2018-06-14 DIAGNOSIS — G4733 Obstructive sleep apnea (adult) (pediatric): Secondary | ICD-10-CM | POA: Diagnosis not present

## 2018-06-14 DIAGNOSIS — E559 Vitamin D deficiency, unspecified: Secondary | ICD-10-CM | POA: Diagnosis not present

## 2018-06-14 DIAGNOSIS — Z1389 Encounter for screening for other disorder: Secondary | ICD-10-CM | POA: Diagnosis not present

## 2018-06-14 DIAGNOSIS — I10 Essential (primary) hypertension: Secondary | ICD-10-CM | POA: Diagnosis not present

## 2018-06-14 DIAGNOSIS — M79601 Pain in right arm: Secondary | ICD-10-CM | POA: Diagnosis not present

## 2018-06-14 DIAGNOSIS — Z8631 Personal history of diabetic foot ulcer: Secondary | ICD-10-CM | POA: Diagnosis not present

## 2018-06-14 DIAGNOSIS — E039 Hypothyroidism, unspecified: Secondary | ICD-10-CM | POA: Diagnosis not present

## 2018-06-14 DIAGNOSIS — E7800 Pure hypercholesterolemia, unspecified: Secondary | ICD-10-CM | POA: Diagnosis not present

## 2018-06-14 DIAGNOSIS — R4182 Altered mental status, unspecified: Secondary | ICD-10-CM | POA: Diagnosis not present

## 2018-06-14 DIAGNOSIS — Z23 Encounter for immunization: Secondary | ICD-10-CM | POA: Diagnosis not present

## 2018-06-14 DIAGNOSIS — Z794 Long term (current) use of insulin: Secondary | ICD-10-CM | POA: Diagnosis not present

## 2018-06-14 DIAGNOSIS — M818 Other osteoporosis without current pathological fracture: Secondary | ICD-10-CM | POA: Diagnosis not present

## 2018-06-14 DIAGNOSIS — E1065 Type 1 diabetes mellitus with hyperglycemia: Secondary | ICD-10-CM | POA: Diagnosis not present

## 2018-08-17 DIAGNOSIS — J019 Acute sinusitis, unspecified: Secondary | ICD-10-CM | POA: Diagnosis not present

## 2018-09-06 DIAGNOSIS — R591 Generalized enlarged lymph nodes: Secondary | ICD-10-CM | POA: Diagnosis not present

## 2018-09-06 DIAGNOSIS — H6123 Impacted cerumen, bilateral: Secondary | ICD-10-CM | POA: Diagnosis not present

## 2018-09-18 DIAGNOSIS — Z794 Long term (current) use of insulin: Secondary | ICD-10-CM | POA: Diagnosis not present

## 2018-09-18 DIAGNOSIS — I10 Essential (primary) hypertension: Secondary | ICD-10-CM | POA: Diagnosis not present

## 2018-09-18 DIAGNOSIS — E1040 Type 1 diabetes mellitus with diabetic neuropathy, unspecified: Secondary | ICD-10-CM | POA: Diagnosis not present

## 2018-09-18 DIAGNOSIS — E559 Vitamin D deficiency, unspecified: Secondary | ICD-10-CM | POA: Diagnosis not present

## 2018-09-18 DIAGNOSIS — M818 Other osteoporosis without current pathological fracture: Secondary | ICD-10-CM | POA: Diagnosis not present

## 2018-09-28 DIAGNOSIS — M21611 Bunion of right foot: Secondary | ICD-10-CM | POA: Diagnosis not present

## 2018-09-28 DIAGNOSIS — Z794 Long term (current) use of insulin: Secondary | ICD-10-CM | POA: Diagnosis not present

## 2018-09-28 DIAGNOSIS — L84 Corns and callosities: Secondary | ICD-10-CM | POA: Diagnosis not present

## 2018-09-28 DIAGNOSIS — M2042 Other hammer toe(s) (acquired), left foot: Secondary | ICD-10-CM | POA: Diagnosis not present

## 2018-09-28 DIAGNOSIS — M2041 Other hammer toe(s) (acquired), right foot: Secondary | ICD-10-CM | POA: Diagnosis not present

## 2018-09-28 DIAGNOSIS — E1042 Type 1 diabetes mellitus with diabetic polyneuropathy: Secondary | ICD-10-CM | POA: Diagnosis not present

## 2018-09-28 DIAGNOSIS — E1065 Type 1 diabetes mellitus with hyperglycemia: Secondary | ICD-10-CM | POA: Diagnosis not present

## 2018-09-28 DIAGNOSIS — M21612 Bunion of left foot: Secondary | ICD-10-CM | POA: Diagnosis not present

## 2018-10-16 DIAGNOSIS — M79605 Pain in left leg: Secondary | ICD-10-CM | POA: Diagnosis not present

## 2018-10-16 DIAGNOSIS — S81802A Unspecified open wound, left lower leg, initial encounter: Secondary | ICD-10-CM | POA: Diagnosis not present

## 2018-10-26 DIAGNOSIS — R079 Chest pain, unspecified: Secondary | ICD-10-CM | POA: Diagnosis not present

## 2018-10-30 DIAGNOSIS — Z23 Encounter for immunization: Secondary | ICD-10-CM | POA: Diagnosis not present

## 2018-10-30 DIAGNOSIS — S81802D Unspecified open wound, left lower leg, subsequent encounter: Secondary | ICD-10-CM | POA: Diagnosis not present

## 2018-11-06 DIAGNOSIS — Z794 Long term (current) use of insulin: Secondary | ICD-10-CM | POA: Diagnosis not present

## 2018-11-06 DIAGNOSIS — E1042 Type 1 diabetes mellitus with diabetic polyneuropathy: Secondary | ICD-10-CM | POA: Diagnosis not present

## 2018-11-06 DIAGNOSIS — E10621 Type 1 diabetes mellitus with foot ulcer: Secondary | ICD-10-CM | POA: Diagnosis not present

## 2018-11-06 DIAGNOSIS — L97522 Non-pressure chronic ulcer of other part of left foot with fat layer exposed: Secondary | ICD-10-CM | POA: Diagnosis not present

## 2018-11-06 DIAGNOSIS — E10622 Type 1 diabetes mellitus with other skin ulcer: Secondary | ICD-10-CM | POA: Diagnosis not present

## 2018-11-06 DIAGNOSIS — L97922 Non-pressure chronic ulcer of unspecified part of left lower leg with fat layer exposed: Secondary | ICD-10-CM | POA: Diagnosis not present

## 2018-11-06 DIAGNOSIS — L97822 Non-pressure chronic ulcer of other part of left lower leg with fat layer exposed: Secondary | ICD-10-CM | POA: Diagnosis not present

## 2018-11-07 DIAGNOSIS — L97922 Non-pressure chronic ulcer of unspecified part of left lower leg with fat layer exposed: Secondary | ICD-10-CM | POA: Diagnosis not present

## 2018-11-08 DIAGNOSIS — L97922 Non-pressure chronic ulcer of unspecified part of left lower leg with fat layer exposed: Secondary | ICD-10-CM | POA: Diagnosis not present

## 2018-11-20 DIAGNOSIS — E1165 Type 2 diabetes mellitus with hyperglycemia: Secondary | ICD-10-CM | POA: Diagnosis not present

## 2018-11-20 DIAGNOSIS — E11622 Type 2 diabetes mellitus with other skin ulcer: Secondary | ICD-10-CM | POA: Diagnosis not present

## 2018-11-20 DIAGNOSIS — L97922 Non-pressure chronic ulcer of unspecified part of left lower leg with fat layer exposed: Secondary | ICD-10-CM | POA: Diagnosis not present

## 2018-11-20 DIAGNOSIS — L97822 Non-pressure chronic ulcer of other part of left lower leg with fat layer exposed: Secondary | ICD-10-CM | POA: Diagnosis not present

## 2018-11-20 DIAGNOSIS — E1142 Type 2 diabetes mellitus with diabetic polyneuropathy: Secondary | ICD-10-CM | POA: Diagnosis not present

## 2018-11-20 DIAGNOSIS — S81802D Unspecified open wound, left lower leg, subsequent encounter: Secondary | ICD-10-CM | POA: Diagnosis not present

## 2018-11-28 DIAGNOSIS — L309 Dermatitis, unspecified: Secondary | ICD-10-CM | POA: Diagnosis not present

## 2018-12-14 DIAGNOSIS — I444 Left anterior fascicular block: Secondary | ICD-10-CM | POA: Diagnosis not present

## 2018-12-14 DIAGNOSIS — E1042 Type 1 diabetes mellitus with diabetic polyneuropathy: Secondary | ICD-10-CM | POA: Diagnosis not present

## 2018-12-14 DIAGNOSIS — E559 Vitamin D deficiency, unspecified: Secondary | ICD-10-CM | POA: Diagnosis not present

## 2018-12-14 DIAGNOSIS — E1065 Type 1 diabetes mellitus with hyperglycemia: Secondary | ICD-10-CM | POA: Diagnosis not present

## 2018-12-14 DIAGNOSIS — R079 Chest pain, unspecified: Secondary | ICD-10-CM | POA: Diagnosis not present

## 2018-12-14 DIAGNOSIS — I4510 Unspecified right bundle-branch block: Secondary | ICD-10-CM | POA: Diagnosis not present

## 2018-12-14 DIAGNOSIS — I10 Essential (primary) hypertension: Secondary | ICD-10-CM | POA: Diagnosis not present

## 2018-12-14 DIAGNOSIS — R000 Tachycardia, unspecified: Secondary | ICD-10-CM | POA: Diagnosis not present

## 2018-12-14 DIAGNOSIS — M818 Other osteoporosis without current pathological fracture: Secondary | ICD-10-CM | POA: Diagnosis not present

## 2018-12-20 DIAGNOSIS — M85852 Other specified disorders of bone density and structure, left thigh: Secondary | ICD-10-CM | POA: Diagnosis not present

## 2018-12-20 DIAGNOSIS — M818 Other osteoporosis without current pathological fracture: Secondary | ICD-10-CM | POA: Diagnosis not present

## 2019-01-04 DIAGNOSIS — E1165 Type 2 diabetes mellitus with hyperglycemia: Secondary | ICD-10-CM | POA: Diagnosis not present

## 2019-01-04 DIAGNOSIS — E1140 Type 2 diabetes mellitus with diabetic neuropathy, unspecified: Secondary | ICD-10-CM | POA: Diagnosis not present

## 2019-01-04 DIAGNOSIS — R079 Chest pain, unspecified: Secondary | ICD-10-CM | POA: Diagnosis not present

## 2019-01-04 DIAGNOSIS — L84 Corns and callosities: Secondary | ICD-10-CM | POA: Diagnosis not present

## 2019-01-04 DIAGNOSIS — M79622 Pain in left upper arm: Secondary | ICD-10-CM | POA: Diagnosis not present

## 2019-01-04 DIAGNOSIS — B351 Tinea unguium: Secondary | ICD-10-CM | POA: Diagnosis not present

## 2019-01-12 DIAGNOSIS — I517 Cardiomegaly: Secondary | ICD-10-CM | POA: Diagnosis not present

## 2019-01-12 DIAGNOSIS — R079 Chest pain, unspecified: Secondary | ICD-10-CM | POA: Diagnosis not present

## 2019-03-27 DIAGNOSIS — E559 Vitamin D deficiency, unspecified: Secondary | ICD-10-CM | POA: Diagnosis not present

## 2019-03-27 DIAGNOSIS — E1065 Type 1 diabetes mellitus with hyperglycemia: Secondary | ICD-10-CM | POA: Diagnosis not present

## 2019-03-27 DIAGNOSIS — I10 Essential (primary) hypertension: Secondary | ICD-10-CM | POA: Diagnosis not present

## 2019-03-27 DIAGNOSIS — E1042 Type 1 diabetes mellitus with diabetic polyneuropathy: Secondary | ICD-10-CM | POA: Diagnosis not present

## 2019-03-27 DIAGNOSIS — M818 Other osteoporosis without current pathological fracture: Secondary | ICD-10-CM | POA: Diagnosis not present

## 2019-04-12 DIAGNOSIS — E1165 Type 2 diabetes mellitus with hyperglycemia: Secondary | ICD-10-CM | POA: Diagnosis not present

## 2019-04-12 DIAGNOSIS — L84 Corns and callosities: Secondary | ICD-10-CM | POA: Diagnosis not present

## 2019-04-12 DIAGNOSIS — B351 Tinea unguium: Secondary | ICD-10-CM | POA: Diagnosis not present

## 2019-04-12 DIAGNOSIS — E1140 Type 2 diabetes mellitus with diabetic neuropathy, unspecified: Secondary | ICD-10-CM | POA: Diagnosis not present

## 2019-05-31 DIAGNOSIS — H66003 Acute suppurative otitis media without spontaneous rupture of ear drum, bilateral: Secondary | ICD-10-CM | POA: Diagnosis not present

## 2019-05-31 DIAGNOSIS — H6123 Impacted cerumen, bilateral: Secondary | ICD-10-CM | POA: Diagnosis not present

## 2019-05-31 DIAGNOSIS — H60393 Other infective otitis externa, bilateral: Secondary | ICD-10-CM | POA: Diagnosis not present

## 2019-05-31 DIAGNOSIS — H9203 Otalgia, bilateral: Secondary | ICD-10-CM | POA: Diagnosis not present

## 2019-06-06 DIAGNOSIS — H9202 Otalgia, left ear: Secondary | ICD-10-CM | POA: Diagnosis not present

## 2019-06-11 DIAGNOSIS — H6982 Other specified disorders of Eustachian tube, left ear: Secondary | ICD-10-CM | POA: Diagnosis not present

## 2019-06-18 DIAGNOSIS — R600 Localized edema: Secondary | ICD-10-CM | POA: Diagnosis not present

## 2019-06-18 DIAGNOSIS — H9202 Otalgia, left ear: Secondary | ICD-10-CM | POA: Diagnosis not present

## 2019-06-19 DIAGNOSIS — M7989 Other specified soft tissue disorders: Secondary | ICD-10-CM | POA: Diagnosis not present

## 2019-06-19 DIAGNOSIS — H6982 Other specified disorders of Eustachian tube, left ear: Secondary | ICD-10-CM | POA: Diagnosis not present

## 2019-06-26 DIAGNOSIS — M818 Other osteoporosis without current pathological fracture: Secondary | ICD-10-CM | POA: Diagnosis not present

## 2019-06-26 DIAGNOSIS — E1065 Type 1 diabetes mellitus with hyperglycemia: Secondary | ICD-10-CM | POA: Diagnosis not present

## 2019-06-26 DIAGNOSIS — I10 Essential (primary) hypertension: Secondary | ICD-10-CM | POA: Diagnosis not present

## 2019-06-26 DIAGNOSIS — E1042 Type 1 diabetes mellitus with diabetic polyneuropathy: Secondary | ICD-10-CM | POA: Diagnosis not present

## 2019-06-26 DIAGNOSIS — E559 Vitamin D deficiency, unspecified: Secondary | ICD-10-CM | POA: Diagnosis not present

## 2019-06-26 DIAGNOSIS — Z794 Long term (current) use of insulin: Secondary | ICD-10-CM | POA: Diagnosis not present

## 2019-07-24 DIAGNOSIS — R0602 Shortness of breath: Secondary | ICD-10-CM | POA: Diagnosis not present

## 2019-07-24 DIAGNOSIS — J3489 Other specified disorders of nose and nasal sinuses: Secondary | ICD-10-CM | POA: Diagnosis not present

## 2019-07-24 DIAGNOSIS — M7989 Other specified soft tissue disorders: Secondary | ICD-10-CM | POA: Diagnosis not present

## 2019-07-24 DIAGNOSIS — R1310 Dysphagia, unspecified: Secondary | ICD-10-CM | POA: Diagnosis not present

## 2019-07-24 DIAGNOSIS — R0789 Other chest pain: Secondary | ICD-10-CM | POA: Diagnosis not present

## 2019-07-31 DIAGNOSIS — L03116 Cellulitis of left lower limb: Secondary | ICD-10-CM | POA: Diagnosis not present

## 2019-07-31 DIAGNOSIS — S81802A Unspecified open wound, left lower leg, initial encounter: Secondary | ICD-10-CM | POA: Diagnosis not present

## 2019-07-31 DIAGNOSIS — Z23 Encounter for immunization: Secondary | ICD-10-CM | POA: Diagnosis not present

## 2019-08-13 DIAGNOSIS — R05 Cough: Secondary | ICD-10-CM | POA: Diagnosis not present

## 2019-08-13 DIAGNOSIS — Z1211 Encounter for screening for malignant neoplasm of colon: Secondary | ICD-10-CM | POA: Diagnosis not present

## 2019-08-13 DIAGNOSIS — R1310 Dysphagia, unspecified: Secondary | ICD-10-CM | POA: Diagnosis not present

## 2019-08-13 DIAGNOSIS — R600 Localized edema: Secondary | ICD-10-CM | POA: Diagnosis not present

## 2019-08-13 DIAGNOSIS — R0602 Shortness of breath: Secondary | ICD-10-CM | POA: Diagnosis not present

## 2019-08-13 DIAGNOSIS — K219 Gastro-esophageal reflux disease without esophagitis: Secondary | ICD-10-CM | POA: Diagnosis not present

## 2019-08-13 DIAGNOSIS — R079 Chest pain, unspecified: Secondary | ICD-10-CM | POA: Diagnosis not present

## 2019-08-14 DIAGNOSIS — R0602 Shortness of breath: Secondary | ICD-10-CM | POA: Diagnosis not present

## 2019-08-14 DIAGNOSIS — I209 Angina pectoris, unspecified: Secondary | ICD-10-CM | POA: Diagnosis not present

## 2019-08-14 DIAGNOSIS — R600 Localized edema: Secondary | ICD-10-CM | POA: Diagnosis not present

## 2019-08-14 DIAGNOSIS — I444 Left anterior fascicular block: Secondary | ICD-10-CM | POA: Diagnosis not present

## 2019-08-14 DIAGNOSIS — I4510 Unspecified right bundle-branch block: Secondary | ICD-10-CM | POA: Diagnosis not present

## 2019-08-14 DIAGNOSIS — I10 Essential (primary) hypertension: Secondary | ICD-10-CM | POA: Diagnosis not present

## 2019-08-14 DIAGNOSIS — F411 Generalized anxiety disorder: Secondary | ICD-10-CM | POA: Diagnosis not present

## 2019-08-14 DIAGNOSIS — R05 Cough: Secondary | ICD-10-CM | POA: Diagnosis not present

## 2019-08-16 DIAGNOSIS — E1140 Type 2 diabetes mellitus with diabetic neuropathy, unspecified: Secondary | ICD-10-CM | POA: Diagnosis not present

## 2019-08-16 DIAGNOSIS — M2041 Other hammer toe(s) (acquired), right foot: Secondary | ICD-10-CM | POA: Diagnosis not present

## 2019-08-16 DIAGNOSIS — M2042 Other hammer toe(s) (acquired), left foot: Secondary | ICD-10-CM | POA: Diagnosis not present

## 2019-08-16 DIAGNOSIS — B351 Tinea unguium: Secondary | ICD-10-CM | POA: Diagnosis not present

## 2019-08-16 DIAGNOSIS — E109 Type 1 diabetes mellitus without complications: Secondary | ICD-10-CM | POA: Diagnosis not present

## 2019-08-16 DIAGNOSIS — E1165 Type 2 diabetes mellitus with hyperglycemia: Secondary | ICD-10-CM | POA: Diagnosis not present

## 2019-08-16 DIAGNOSIS — M21619 Bunion of unspecified foot: Secondary | ICD-10-CM | POA: Diagnosis not present

## 2019-08-16 DIAGNOSIS — L84 Corns and callosities: Secondary | ICD-10-CM | POA: Diagnosis not present

## 2019-10-10 DIAGNOSIS — I444 Left anterior fascicular block: Secondary | ICD-10-CM | POA: Diagnosis not present

## 2019-10-10 DIAGNOSIS — I200 Unstable angina: Secondary | ICD-10-CM | POA: Diagnosis not present

## 2019-10-10 DIAGNOSIS — I4510 Unspecified right bundle-branch block: Secondary | ICD-10-CM | POA: Diagnosis not present

## 2019-10-15 DIAGNOSIS — E559 Vitamin D deficiency, unspecified: Secondary | ICD-10-CM | POA: Diagnosis not present

## 2019-10-15 DIAGNOSIS — E1042 Type 1 diabetes mellitus with diabetic polyneuropathy: Secondary | ICD-10-CM | POA: Diagnosis not present

## 2019-10-15 DIAGNOSIS — I10 Essential (primary) hypertension: Secondary | ICD-10-CM | POA: Diagnosis not present

## 2019-10-15 DIAGNOSIS — M818 Other osteoporosis without current pathological fracture: Secondary | ICD-10-CM | POA: Diagnosis not present

## 2019-10-15 DIAGNOSIS — R0602 Shortness of breath: Secondary | ICD-10-CM | POA: Diagnosis not present

## 2019-10-15 DIAGNOSIS — I444 Left anterior fascicular block: Secondary | ICD-10-CM | POA: Diagnosis not present

## 2019-10-15 DIAGNOSIS — R600 Localized edema: Secondary | ICD-10-CM | POA: Diagnosis not present

## 2019-10-15 DIAGNOSIS — E1065 Type 1 diabetes mellitus with hyperglycemia: Secondary | ICD-10-CM | POA: Diagnosis not present

## 2019-10-15 DIAGNOSIS — I4510 Unspecified right bundle-branch block: Secondary | ICD-10-CM | POA: Diagnosis not present

## 2019-10-15 DIAGNOSIS — Z794 Long term (current) use of insulin: Secondary | ICD-10-CM | POA: Diagnosis not present

## 2019-12-06 DIAGNOSIS — H60313 Diffuse otitis externa, bilateral: Secondary | ICD-10-CM | POA: Diagnosis not present

## 2019-12-06 DIAGNOSIS — I10 Essential (primary) hypertension: Secondary | ICD-10-CM | POA: Diagnosis not present

## 2019-12-06 DIAGNOSIS — H66003 Acute suppurative otitis media without spontaneous rupture of ear drum, bilateral: Secondary | ICD-10-CM | POA: Diagnosis not present

## 2019-12-06 DIAGNOSIS — H6123 Impacted cerumen, bilateral: Secondary | ICD-10-CM | POA: Diagnosis not present

## 2020-01-20 DIAGNOSIS — J0100 Acute maxillary sinusitis, unspecified: Secondary | ICD-10-CM | POA: Diagnosis not present

## 2020-01-20 DIAGNOSIS — Z20822 Contact with and (suspected) exposure to covid-19: Secondary | ICD-10-CM | POA: Diagnosis not present

## 2020-02-25 DIAGNOSIS — E1042 Type 1 diabetes mellitus with diabetic polyneuropathy: Secondary | ICD-10-CM | POA: Diagnosis not present

## 2020-02-25 DIAGNOSIS — E1065 Type 1 diabetes mellitus with hyperglycemia: Secondary | ICD-10-CM | POA: Diagnosis not present

## 2020-06-29 ENCOUNTER — Emergency Department (HOSPITAL_BASED_OUTPATIENT_CLINIC_OR_DEPARTMENT_OTHER): Payer: Medicare HMO

## 2020-06-29 ENCOUNTER — Encounter (HOSPITAL_BASED_OUTPATIENT_CLINIC_OR_DEPARTMENT_OTHER): Payer: Self-pay | Admitting: Urology

## 2020-06-29 ENCOUNTER — Other Ambulatory Visit: Payer: Self-pay

## 2020-06-29 ENCOUNTER — Emergency Department (HOSPITAL_BASED_OUTPATIENT_CLINIC_OR_DEPARTMENT_OTHER)
Admission: EM | Admit: 2020-06-29 | Discharge: 2020-06-29 | Disposition: A | Discharge status: Home / Self Care | Payer: Medicare HMO | Attending: Emergency Medicine | Admitting: Emergency Medicine

## 2020-06-29 DIAGNOSIS — M542 Cervicalgia: Secondary | ICD-10-CM | POA: Diagnosis not present

## 2020-06-29 DIAGNOSIS — I10 Essential (primary) hypertension: Secondary | ICD-10-CM | POA: Diagnosis not present

## 2020-06-29 DIAGNOSIS — S299XXA Unspecified injury of thorax, initial encounter: Secondary | ICD-10-CM | POA: Diagnosis present

## 2020-06-29 DIAGNOSIS — Z794 Long term (current) use of insulin: Secondary | ICD-10-CM | POA: Diagnosis not present

## 2020-06-29 DIAGNOSIS — Y92410 Unspecified street and highway as the place of occurrence of the external cause: Secondary | ICD-10-CM | POA: Diagnosis not present

## 2020-06-29 DIAGNOSIS — Z79899 Other long term (current) drug therapy: Secondary | ICD-10-CM | POA: Insufficient documentation

## 2020-06-29 DIAGNOSIS — S2232XA Fracture of one rib, left side, initial encounter for closed fracture: Secondary | ICD-10-CM | POA: Diagnosis not present

## 2020-06-29 DIAGNOSIS — E119 Type 2 diabetes mellitus without complications: Secondary | ICD-10-CM | POA: Diagnosis not present

## 2020-06-29 DIAGNOSIS — M546 Pain in thoracic spine: Secondary | ICD-10-CM | POA: Diagnosis not present
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
## 2020-07-30 ENCOUNTER — Other Ambulatory Visit: Payer: Self-pay

## 2020-07-30 ENCOUNTER — Emergency Department (HOSPITAL_BASED_OUTPATIENT_CLINIC_OR_DEPARTMENT_OTHER)
Admission: EM | Admit: 2020-07-30 | Discharge: 2020-07-30 | Disposition: A | Discharge status: Home / Self Care | Payer: Medicare HMO | Attending: Emergency Medicine | Admitting: Emergency Medicine

## 2020-07-30 ENCOUNTER — Encounter (HOSPITAL_BASED_OUTPATIENT_CLINIC_OR_DEPARTMENT_OTHER): Payer: Self-pay

## 2020-07-30 DIAGNOSIS — M545 Low back pain, unspecified: Secondary | ICD-10-CM | POA: Insufficient documentation

## 2020-07-30 DIAGNOSIS — E119 Type 2 diabetes mellitus without complications: Secondary | ICD-10-CM | POA: Insufficient documentation

## 2020-07-30 DIAGNOSIS — Z794 Long term (current) use of insulin: Secondary | ICD-10-CM | POA: Insufficient documentation

## 2020-07-30 DIAGNOSIS — I10 Essential (primary) hypertension: Secondary | ICD-10-CM | POA: Insufficient documentation

## 2020-07-30 DIAGNOSIS — Z79899 Other long term (current) drug therapy: Secondary | ICD-10-CM | POA: Diagnosis not present

## 2020-07-30 MED ORDER — LIDOCAINE 5 % EX PTCH: 1.0000 | MEDICATED_PATCH | CUTANEOUS | 0 refills | Status: AC
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
## 2020-07-30 NOTE — Discharge Instructions (Signed)
You came to the emerge department today to be evaluated for your left-sided back pain.  Your physical exam was reassuring.  Your pain is likely musculoskeletal in nature.  I have given you a prescription for lidocaine patches.  Please continue to take your Flexeril and meloxicam as prescribed.  Please follow-up with your primary care provider in 1 week if your symptoms do not improve.  Get help right away if: You develop new bowel or bladder control problems. You have unusual weakness or numbness in your arms or legs. You develop nausea or vomiting. You develop abdominal pain. You feel faint.
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
